# Patient Record
Sex: Female | Born: 1960 | Race: White | Hispanic: No | Smoking: Current every day smoker
Health system: Southern US, Community
[De-identification: ages and names within clinical notes are randomized; demographics above are authoritative.]

## PROBLEM LIST (undated history)

## (undated) DIAGNOSIS — J449 Chronic obstructive pulmonary disease, unspecified: Secondary | ICD-10-CM

## (undated) DIAGNOSIS — E785 Hyperlipidemia, unspecified: Secondary | ICD-10-CM

## (undated) DIAGNOSIS — I639 Cerebral infarction, unspecified: Secondary | ICD-10-CM

## (undated) DIAGNOSIS — R569 Unspecified convulsions: Secondary | ICD-10-CM

## (undated) DIAGNOSIS — I10 Essential (primary) hypertension: Secondary | ICD-10-CM

## (undated) DIAGNOSIS — G43909 Migraine, unspecified, not intractable, without status migrainosus: Secondary | ICD-10-CM

## (undated) DIAGNOSIS — J45909 Unspecified asthma, uncomplicated: Secondary | ICD-10-CM

## (undated) HISTORY — PX: BLADDER SURGERY: SHX569

---

## 2013-12-08 ENCOUNTER — Encounter (HOSPITAL_BASED_OUTPATIENT_CLINIC_OR_DEPARTMENT_OTHER): Payer: Self-pay | Admitting: Emergency Medicine

## 2013-12-08 ENCOUNTER — Emergency Department (HOSPITAL_BASED_OUTPATIENT_CLINIC_OR_DEPARTMENT_OTHER): Payer: Medicaid Other

## 2013-12-08 ENCOUNTER — Emergency Department (HOSPITAL_BASED_OUTPATIENT_CLINIC_OR_DEPARTMENT_OTHER)
Admission: EM | Admit: 2013-12-08 | Discharge: 2013-12-08 | Disposition: A | Discharge status: Home / Self Care | Payer: Medicaid Other | Attending: Emergency Medicine | Admitting: Emergency Medicine

## 2013-12-08 DIAGNOSIS — I10 Essential (primary) hypertension: Secondary | ICD-10-CM | POA: Insufficient documentation

## 2013-12-08 DIAGNOSIS — R209 Unspecified disturbances of skin sensation: Secondary | ICD-10-CM | POA: Diagnosis present

## 2013-12-08 DIAGNOSIS — J449 Chronic obstructive pulmonary disease, unspecified: Secondary | ICD-10-CM | POA: Insufficient documentation

## 2013-12-08 DIAGNOSIS — Z79899 Other long term (current) drug therapy: Secondary | ICD-10-CM | POA: Insufficient documentation

## 2013-12-08 DIAGNOSIS — Z862 Personal history of diseases of the blood and blood-forming organs and certain disorders involving the immune mechanism: Secondary | ICD-10-CM | POA: Diagnosis not present

## 2013-12-08 DIAGNOSIS — R42 Dizziness and giddiness: Secondary | ICD-10-CM | POA: Diagnosis not present

## 2013-12-08 DIAGNOSIS — F17200 Nicotine dependence, unspecified, uncomplicated: Secondary | ICD-10-CM | POA: Diagnosis not present

## 2013-12-08 DIAGNOSIS — J4489 Other specified chronic obstructive pulmonary disease: Secondary | ICD-10-CM | POA: Insufficient documentation

## 2013-12-08 DIAGNOSIS — R51 Headache: Secondary | ICD-10-CM | POA: Diagnosis not present

## 2013-12-08 DIAGNOSIS — Z8639 Personal history of other endocrine, nutritional and metabolic disease: Secondary | ICD-10-CM | POA: Insufficient documentation

## 2013-12-08 HISTORY — DX: Chronic obstructive pulmonary disease, unspecified: J44.9

## 2013-12-08 HISTORY — DX: Hyperlipidemia, unspecified: E78.5

## 2013-12-08 HISTORY — DX: Essential (primary) hypertension: I10

## 2013-12-08 HISTORY — DX: Unspecified asthma, uncomplicated: J45.909

## 2013-12-08 LAB — COMPREHENSIVE METABOLIC PANEL
ALK PHOS: 132 U/L — AB (ref 39–117)
ALT: 28 U/L (ref 0–35)
ANION GAP: 16 — AB (ref 5–15)
AST: 21 U/L (ref 0–37)
Albumin: 3.9 g/dL (ref 3.5–5.2)
BUN: 9 mg/dL (ref 6–23)
CALCIUM: 9.9 mg/dL (ref 8.4–10.5)
CO2: 26 meq/L (ref 19–32)
Chloride: 103 mEq/L (ref 96–112)
Creatinine, Ser: 0.9 mg/dL (ref 0.50–1.10)
GFR calc Af Amer: 84 mL/min — ABNORMAL LOW (ref >90)
GFR, EST NON AFRICAN AMERICAN: 72 mL/min — AB (ref >90)
GLUCOSE: 102 mg/dL — AB (ref 70–99)
POTASSIUM: 3.7 meq/L (ref 3.7–5.3)
SODIUM: 145 meq/L (ref 137–147)
Total Bilirubin: 0.2 mg/dL — ABNORMAL LOW (ref 0.3–1.2)
Total Protein: 7.8 g/dL (ref 6.0–8.3)

## 2013-12-08 LAB — CBC WITH DIFFERENTIAL/PLATELET
Basophils Absolute: 0 10*3/uL (ref 0.0–0.1)
Basophils Relative: 0 % (ref 0–1)
EOS PCT: 3 % (ref 0–5)
Eosinophils Absolute: 0.2 10*3/uL (ref 0.0–0.7)
HCT: 40.4 % (ref 36.0–46.0)
HEMOGLOBIN: 13.4 g/dL (ref 12.0–15.0)
LYMPHS ABS: 1.9 10*3/uL (ref 0.7–4.0)
Lymphocytes Relative: 21 % (ref 12–46)
MCH: 29.5 pg (ref 26.0–34.0)
MCHC: 33.2 g/dL (ref 30.0–36.0)
MCV: 89 fL (ref 78.0–100.0)
Monocytes Absolute: 0.6 10*3/uL (ref 0.1–1.0)
Monocytes Relative: 7 % (ref 3–12)
Neutro Abs: 6.1 10*3/uL (ref 1.7–7.7)
Neutrophils Relative %: 69 % (ref 43–77)
PLATELETS: 231 10*3/uL (ref 150–400)
RBC: 4.54 MIL/uL (ref 3.87–5.11)
RDW: 13.9 % (ref 11.5–15.5)
WBC: 8.9 10*3/uL (ref 4.0–10.5)

## 2013-12-08 LAB — URINALYSIS, ROUTINE W REFLEX MICROSCOPIC
Bilirubin Urine: NEGATIVE
Glucose, UA: NEGATIVE mg/dL
HGB URINE DIPSTICK: NEGATIVE
Ketones, ur: NEGATIVE mg/dL
Leukocytes, UA: NEGATIVE
NITRITE: NEGATIVE
PROTEIN: NEGATIVE mg/dL
Specific Gravity, Urine: 1.005 (ref 1.005–1.030)
Urobilinogen, UA: 0.2 mg/dL (ref 0.0–1.0)
pH: 6 (ref 5.0–8.0)

## 2013-12-08 MED ORDER — HYDROCODONE-ACETAMINOPHEN 5-325 MG PO TABS: 2.0000 | ORAL_TABLET | ORAL | Status: DC | PRN

## 2013-12-08 NOTE — ED Notes (Signed)
Reports fall on Monday, numbness/tingling all over body. Reports HA. Pt neurologically intact

## 2013-12-08 NOTE — Discharge Instructions (Signed)

## 2013-12-08 NOTE — ED Provider Notes (Signed)
Medical screening examination/treatment/procedure(s) were performed by non-physician practitioner and as supervising physician I was immediately available for consultation/collaboration.   EKG Interpretation   Date/Time:  Thursday December 08 2013 16:46:49 EDT Ventricular Rate:  75 PR Interval:  148 QRS Duration: 84 QT Interval:  396 QTC Calculation: 442 R Axis:   63 Text Interpretation:  Normal sinus rhythm Normal ECG No old tracing to  compare Confirmed by Isabell Bonafede  MD-J, Monterius Rolf [phone]) on 12/08/2013 4:47:46 PM        Linwood Dibbles, MD 12/08/13 [id number]

## 2013-12-08 NOTE — ED Provider Notes (Signed)
CSN: [id number]     Arrival date & time 12/08/13  1513 History   First MD Initiated Contact with Patient 12/08/13 1536     Chief Complaint  Patient presents with  . Numbness  . Fall     (Consider location/radiation/quality/duration/timing/severity/associated sxs/prior Treatment) Patient is a 53 y.o. female presenting with fall. The history is provided by the patient. No language interpreter was used.  Fall This is a new problem. The current episode started today. The problem occurs constantly. The problem has been gradually worsening. Associated symptoms include headaches and numbness. Pertinent negatives include no nausea. Nothing aggravates the symptoms. She has tried nothing for the symptoms. The treatment provided no relief.  Pt fell 2 days ago.  Pt reports she became dizzy and passed out.  Pt reports she told her psychiatrist who told her to come in for evaluation for stroke or seizure.   Pt has multiple area that are numb on and off.  Pt his haed and back and has pain in back and head  Past Medical History  Diagnosis Date  . Asthma   . COPD (chronic obstructive pulmonary disease)   . Hypertension   . Hyperlipidemia    History reviewed. No pertinent past surgical history. No family history on file. History  Substance Use Topics  . Smoking status: Current Every Day Smoker -- 2.00 packs/day    Types: Cigarettes  . Smokeless tobacco: Not on file  . Alcohol Use: No   OB History   Grav Para Term Preterm Abortions TAB SAB Ect Mult Living                 Review of Systems  Gastrointestinal: Negative for nausea.  Neurological: Positive for numbness and headaches.  All other systems reviewed and are negative.     Allergies  Review of patient's allergies indicates no known allergies.  Home Medications   Prior to Admission medications   Medication Sig Start Date End Date Taking? Authorizing Provider  clonazePAM (KLONOPIN) 0.5 MG tablet Take 0.5 mg by mouth 2 (two) times  daily as needed for anxiety.   Yes Historical Provider, MD   BP 145/86  Pulse 89  Temp(Src) 98 F (36.7 C) (Oral)  Resp 18  Ht  (1.626 m)  Wt 167 lb (75.751 kg)  BMI 28.65 kg/m2  SpO2 97% Physical Exam  Nursing note and vitals reviewed. Constitutional: She is oriented to person, place, and time. She appears well-developed and well-nourished.  HENT:  Head: Normocephalic and atraumatic.  Right Ear: External ear normal.  Nose: Nose normal.  Mouth/Throat: Oropharynx is clear and moist.  Eyes: EOM are normal. Pupils are equal, round, and reactive to light.  Neck: Normal range of motion. Neck supple.  Cardiovascular: Normal rate and normal heart sounds.   Pulmonary/Chest: Effort normal.  Abdominal: She exhibits no distension.  Musculoskeletal: Normal range of motion.  Neurological: She is alert and oriented to person, place, and time.  Skin: Skin is warm.  Psychiatric: She has a normal mood and affect.    ED Course  Procedures (including critical care time) Labs Review Labs Reviewed  CBC WITH DIFFERENTIAL  URINALYSIS, ROUTINE W REFLEX MICROSCOPIC  COMPREHENSIVE METABOLIC PANEL    Imaging Review Dg Lumbar Spine Complete  12/08/2013   CLINICAL DATA:  Status post fall 2 days ago hitting lumbar spine on the bath tub.  EXAM: LUMBAR SPINE - COMPLETE 4+ VIEW  COMPARISON:  None.  FINDINGS: There is no evidence of lumbar spine fracture.  Alignment is normal. There mild degenerative joint changes with anterior osteophytosis and narrowed joint space in the lumbar spine. Tubal ligation clips are noted.  IMPRESSION: No acute fracture or dislocation. Mild degenerative joint changes of lumbar spine.   Electronically Signed   By: Sherian Rein M.D.   On: 12/08/2013 16:49   Ct Head Wo Contrast  12/08/2013   CLINICAL DATA:  Status post fall 2 days ago striking the posterior aspect of the head  EXAM: CT HEAD WITHOUT CONTRAST  TECHNIQUE: Contiguous axial images were obtained from the base of  the skull through the vertex without intravenous contrast.  COMPARISON:  None.  FINDINGS: The ventricles are normal in size and position. There is no intracranial hemorrhage nor intracranial mass effect. There is no acute ischemic change. 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 There is subtle decreased density in the deep white matter of both cerebral hemispheres consistent with chronic small vessel ischemic change. The cerebellum and brainstem are unremarkable.  The observed paranasal sinuses and mastoid air cells exhibit no air-fluid levels. There is no acute skull fracture. There is no cephalohematoma.  IMPRESSION: 1. There is no acute intracranial hemorrhage nor other acute intracranial abnormality. There are mild changes of chronic small vessel ischemia. 2. There is no acute skull fracture.   Electronically Signed   By: David  [place]   On: 12/08/2013 16:39     EKG Interpretation   Date/Time:  Thursday December 08 2013 16:46:49 EDT Ventricular Rate:  75 PR Interval:  148 QRS Duration: 84 QT Interval:  396 QTC Calculation: 442 R Axis:   63 Text Interpretation:  Normal sinus rhythm Normal ECG No old tracing to  compare Confirmed by KNAPP  MD-J, JON ([id number]) on 12/08/2013 4:47:46 PM     Results for orders placed during the hospital encounter of 12/08/13  CBC WITH DIFFERENTIAL      Result Value Ref Range   WBC 8.9  4.0 - 10.5 K/uL   RBC 4.54  3.87 - 5.11 MIL/uL   Hemoglobin 13.4  12.0 - 15.0 g/dL   HCT 60.4  54.0 - 98.1 %   MCV 89.0  78.0 - 100.0 fL   MCH 29.5  26.0 - 34.0 pg   MCHC 33.2  30.0 - 36.0 g/dL   RDW 19.1  47.8 - 29.5 %   Platelets 231  150 - 400 K/uL   Neutrophils Relative % 69  43 - 77 %   Neutro Abs 6.1  1.7 - 7.7 K/uL   Lymphocytes Relative 21  12 - 46 %   Lymphs Abs 1.9  0.7 - 4.0 K/uL   Monocytes Relative 7  3 - 12 %   Monocytes Absolute 0.6  0.1 - 1.0 K/uL   Eosinophils Relative 3  0 - 5 %   Eosinophils  Absolute 0.2  0.0 - 0.7 K/uL   Basophils Relative 0  0 - 1 %   Basophils Absolute 0.0  0.0 - 0.1 K/uL  COMPREHENSIVE METABOLIC PANEL      Result Value Ref Range   Sodium 145  137 - 147 mEq/L   Potassium 3.7  3.7 - 5.3 mEq/L   Chloride 103  96 - 112 mEq/L   CO2 26  19 - 32 mEq/L   Glucose, Bld 102 (*) 70 - 99 mg/dL   BUN 9  6 - 23 mg/dL   Creatinine, Ser 6.21  0.50 - 1.10 mg/dL  Calcium 9.9  8.4 - 10.5 mg/dL   Total Protein 7.8  6.0 - 8.3 g/dL   Albumin 3.9  3.5 - 5.2 g/dL   AST 21  0 - 37 U/L   ALT 28  0 - 35 U/L   Alkaline Phosphatase 132 (*) 39 - 117 U/L   Total Bilirubin 0.2 (*) 0.3 - 1.2 mg/dL   GFR calc non Af Amer 72 (*) >90 mL/min   GFR calc Af Amer 84 (*) >90 mL/min   Anion gap 16 (*) 5 - 15  URINALYSIS, ROUTINE W REFLEX MICROSCOPIC      Result Value Ref Range   Color, Urine YELLOW  YELLOW   APPearance CLEAR  CLEAR   Specific Gravity, Urine 1.005  1.005 - 1.030   pH 6.0  5.0 - 8.0   Glucose, UA NEGATIVE  NEGATIVE mg/dL   Hgb urine dipstick NEGATIVE  NEGATIVE   Bilirubin Urine NEGATIVE  NEGATIVE   Ketones, ur NEGATIVE  NEGATIVE mg/dL   Protein, ur NEGATIVE  NEGATIVE mg/dL   Urobilinogen, UA 0.2  0.0 - 1.0 mg/dL   Nitrite NEGATIVE  NEGATIVE   Leukocytes, UA NEGATIVE  NEGATIVE   Dg Lumbar Spine Complete  12/08/2013   CLINICAL DATA:  Status post fall 2 days ago hitting lumbar spine on the bath tub.  EXAM: LUMBAR SPINE - COMPLETE 4+ VIEW  COMPARISON:  None.  FINDINGS: There is no evidence of lumbar spine fracture. Alignment is normal. There mild degenerative joint changes with anterior osteophytosis and narrowed joint space in the lumbar spine. Tubal ligation clips are noted.  IMPRESSION: No acute fracture or dislocation. Mild degenerative joint changes of lumbar spine.   Electronically Signed   By: Sherian Rein M.D.   On: 12/08/2013 16:49   Ct Head Wo Contrast  12/08/2013   CLINICAL DATA:  Status post fall 2 days ago striking the posterior aspect of the head  EXAM: CT  HEAD WITHOUT CONTRAST  TECHNIQUE: Contiguous axial images were obtained from the base of the skull through the vertex without intravenous contrast.  COMPARISON:  None.  FINDINGS: The ventricles are normal in size and position. There is no intracranial hemorrhage nor intracranial mass effect. There is no acute ischemic change. 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 There is subtle decreased density in the deep white matter of both cerebral hemispheres consistent with chronic small vessel ischemic change. The cerebellum and brainstem are unremarkable.  The observed paranasal sinuses and mastoid air cells exhibit no air-fluid levels. There is no acute skull fracture. There is no cephalohematoma.  IMPRESSION: 1. There is no acute intracranial hemorrhage nor other acute intracranial abnormality. There are mild changes of chronic small vessel ischemia. 2. There is no acute skull fracture.   Electronically Signed   By: David  [place]   On: 12/08/2013 16:39    MDM normal neuro exam.   I doubt cva,  Pt advised to follow up with her MD for recheck   Final diagnoses:  Dizziness        Elson Areas, PA-C 12/08/13 Rickey Primus

## 2014-01-04 ENCOUNTER — Emergency Department (HOSPITAL_COMMUNITY): Payer: Medicaid Other

## 2014-01-04 ENCOUNTER — Emergency Department (HOSPITAL_COMMUNITY)
Admission: EM | Admit: 2014-01-04 | Discharge: 2014-01-04 | Disposition: A | Discharge status: Home / Self Care | Payer: Medicaid Other | Attending: Emergency Medicine | Admitting: Emergency Medicine

## 2014-01-04 ENCOUNTER — Encounter (HOSPITAL_COMMUNITY): Payer: Self-pay | Admitting: Emergency Medicine

## 2014-01-04 DIAGNOSIS — Y929 Unspecified place or not applicable: Secondary | ICD-10-CM | POA: Diagnosis not present

## 2014-01-04 DIAGNOSIS — Z720 Tobacco use: Secondary | ICD-10-CM | POA: Insufficient documentation

## 2014-01-04 DIAGNOSIS — S0003XA Contusion of scalp, initial encounter: Secondary | ICD-10-CM | POA: Insufficient documentation

## 2014-01-04 DIAGNOSIS — S0990XA Unspecified injury of head, initial encounter: Secondary | ICD-10-CM | POA: Insufficient documentation

## 2014-01-04 DIAGNOSIS — R000 Tachycardia, unspecified: Secondary | ICD-10-CM | POA: Insufficient documentation

## 2014-01-04 DIAGNOSIS — F419 Anxiety disorder, unspecified: Secondary | ICD-10-CM | POA: Diagnosis not present

## 2014-01-04 DIAGNOSIS — J449 Chronic obstructive pulmonary disease, unspecified: Secondary | ICD-10-CM | POA: Diagnosis not present

## 2014-01-04 DIAGNOSIS — R569 Unspecified convulsions: Secondary | ICD-10-CM | POA: Diagnosis present

## 2014-01-04 DIAGNOSIS — Z8639 Personal history of other endocrine, nutritional and metabolic disease: Secondary | ICD-10-CM | POA: Diagnosis not present

## 2014-01-04 DIAGNOSIS — I10 Essential (primary) hypertension: Secondary | ICD-10-CM | POA: Insufficient documentation

## 2014-01-04 DIAGNOSIS — Y939 Activity, unspecified: Secondary | ICD-10-CM | POA: Insufficient documentation

## 2014-01-04 DIAGNOSIS — Z79899 Other long term (current) drug therapy: Secondary | ICD-10-CM | POA: Insufficient documentation

## 2014-01-04 DIAGNOSIS — W07XXXA Fall from chair, initial encounter: Secondary | ICD-10-CM | POA: Insufficient documentation

## 2014-01-04 LAB — CBC
HCT: 46.8 % — ABNORMAL HIGH (ref 36.0–46.0)
Hemoglobin: 15.5 g/dL — ABNORMAL HIGH (ref 12.0–15.0)
MCH: 29.9 pg (ref 26.0–34.0)
MCHC: 33.1 g/dL (ref 30.0–36.0)
MCV: 90.2 fL (ref 78.0–100.0)
Platelets: 263 10*3/uL (ref 150–400)
RBC: 5.19 MIL/uL — ABNORMAL HIGH (ref 3.87–5.11)
RDW: 13.7 % (ref 11.5–15.5)
WBC: 12.5 10*3/uL — ABNORMAL HIGH (ref 4.0–10.5)

## 2014-01-04 LAB — BASIC METABOLIC PANEL
ANION GAP: 12 (ref 5–15)
BUN: 10 mg/dL (ref 6–23)
CALCIUM: 10.1 mg/dL (ref 8.4–10.5)
CO2: 29 mEq/L (ref 19–32)
CREATININE: 0.73 mg/dL (ref 0.50–1.10)
Chloride: 102 mEq/L (ref 96–112)
GFR calc Af Amer: >90 mL/min (ref >90)
Glucose, Bld: 105 mg/dL — ABNORMAL HIGH (ref 70–99)
Potassium: 4.5 mEq/L (ref 3.7–5.3)
Sodium: 143 mEq/L (ref 137–147)

## 2014-01-04 LAB — ETHANOL

## 2014-01-04 LAB — RAPID URINE DRUG SCREEN, HOSP PERFORMED
Amphetamines: NOT DETECTED
BARBITURATES: NOT DETECTED
Benzodiazepines: NOT DETECTED
Cocaine: NOT DETECTED
Opiates: NOT DETECTED
Tetrahydrocannabinol: NOT DETECTED

## 2014-01-04 LAB — URINALYSIS, ROUTINE W REFLEX MICROSCOPIC
BILIRUBIN URINE: NEGATIVE
Glucose, UA: NEGATIVE mg/dL
KETONES UR: NEGATIVE mg/dL
Leukocytes, UA: NEGATIVE
NITRITE: NEGATIVE
PH: 7 (ref 5.0–8.0)
PROTEIN: NEGATIVE mg/dL
Specific Gravity, Urine: 1.007 (ref 1.005–1.030)
Urobilinogen, UA: 0.2 mg/dL (ref 0.0–1.0)

## 2014-01-04 LAB — URINE MICROSCOPIC-ADD ON

## 2014-01-04 LAB — I-STAT CG4 LACTIC ACID, ED: Lactic Acid, Venous: 1.74 mmol/L (ref 0.5–2.2)

## 2014-01-04 MED ORDER — ACETAMINOPHEN 325 MG PO TABS
650.0000 mg | ORAL_TABLET | Freq: Once | ORAL | Status: AC
  Administered 2014-01-04: 650 mg via ORAL
  Filled 2014-01-04: qty 2

## 2014-01-04 NOTE — ED Provider Notes (Signed)
CSN: [id number]     Arrival date & time 01/04/14  1343 History   First MD Initiated Contact with Patient 01/04/14 1351     Chief Complaint  Patient presents with  . Seizures     (Consider location/radiation/quality/duration/timing/severity/associated sxs/prior Treatment) HPI  This is a 53 y/o female with a PMHx of asthma, COPD, HTN, hyperlipidemia, depression and anxiety who presents to the ED via EMS from her psychiatrists office after having a possible pseudoseizure witnessed by psychiatrist. She was noted to fall forward out of her chair face first onto the ground. No LOC. Pt reports prior to the episode she felt "shaky". No hx of seizures, however did have a similar episode about 2 months ago. No post-ictal state reported. Currently pt is complaining of pain to her forehead where she landed. Denies confusion, vision change, lightheadedness, dizziness, weakness, cp, sob, n/v. Admits to being under a great amount of stress and anxiety. Denies SI/HI.   Past Medical History  Diagnosis Date  . Asthma   . COPD (chronic obstructive pulmonary disease)   . Hypertension   . Hyperlipidemia    No past surgical history on file. No family history on file. History  Substance Use Topics  . Smoking status: Current Every Day Smoker -- 2.00 packs/day    Types: Cigarettes  . Smokeless tobacco: Not on file  . Alcohol Use: No   OB History   Grav Para Term Preterm Abortions TAB SAB Ect Mult Living                 Review of Systems  Skin: Positive for wound (abrasions).  Neurological: Positive for seizures.  All other systems reviewed and are negative.     Allergies  Review of patient's allergies indicates no known allergies.  Home Medications   Prior to Admission medications   Medication Sig Start Date End Date Taking? Authorizing Provider  clonazePAM (KLONOPIN) 2 MG tablet Take 2 mg by mouth 3 (three) times daily as needed for anxiety.   Yes Historical Provider, MD    ipratropium-albuterol (DUONEB) 0.5-2.5 (3) MG/3ML SOLN Take 3 mLs by nebulization 4 (four) times daily as needed (wheezing).   Yes Historical Provider, MD  mirtazapine (REMERON) 15 MG tablet Take 15 mg by mouth at bedtime.   Yes Historical Provider, MD  zolpidem (AMBIEN) 5 MG tablet Take 5 mg by mouth at bedtime as needed for sleep.   Yes Historical Provider, MD   BP 165/84  Pulse 82  Temp(Src) 99.8 F (37.7 C) (Oral)  Resp 15  SpO2 100% Physical Exam  Nursing note and vitals reviewed. Constitutional: She is oriented to person, place, and time. She appears well-developed and well-nourished. No distress.  HENT:  Head: Normocephalic.    Right Ear: No hemotympanum.  Left Ear: No hemotympanum.  Nose:    Mouth/Throat: Oropharynx is clear and moist.  Nares patent. No epistaxis.  Eyes: Conjunctivae and EOM are normal. Pupils are equal, round, and reactive to light.  Neck: Normal range of motion. Neck supple.  Cardiovascular: Regular rhythm, normal heart sounds and intact distal pulses.   Mild tachycardia.  Pulmonary/Chest: Effort normal and breath sounds normal. No respiratory distress.  Abdominal: Soft. Bowel sounds are normal. There is no tenderness.  Musculoskeletal: Normal range of motion. She exhibits no edema.  Neurological: She is alert and oriented to person, place, and time. She has normal strength. No cranial nerve deficit or sensory deficit. Coordination and gait normal.  Speech fluent, goal oriented. Moves limbs without  ataxia. Equal grip strength bilateral.  Skin: Skin is warm and dry. No rash noted. She is not diaphoretic.  Psychiatric: Her behavior is normal.  Anxious.    ED Course  Procedures (including critical care time) Labs Review Labs Reviewed  CBC - Abnormal; Notable for the following:    WBC 12.5 (*)    RBC 5.19 (*)    Hemoglobin 15.5 (*)    HCT 46.8 (*)    All other components within normal limits  BASIC METABOLIC PANEL - Abnormal; Notable for the  following:    Glucose, Bld 105 (*)    All other components within normal limits  URINE RAPID DRUG SCREEN (HOSP PERFORMED)  ETHANOL  URINALYSIS, ROUTINE W REFLEX MICROSCOPIC  I-STAT CG4 LACTIC ACID, ED    Imaging Review Ct Head Wo Contrast  01/04/2014   CLINICAL DATA:  Seizure  EXAM: CT HEAD WITHOUT CONTRAST  CT MAXILLOFACIAL WITHOUT CONTRAST  TECHNIQUE: Multidetector CT imaging of the head and maxillofacial structures were performed using the standard protocol without intravenous contrast. Multiplanar CT image reconstructions of the maxillofacial structures were also generated.  COMPARISON:  CT head 12/08/2013  FINDINGS: CT HEAD FINDINGS  Frontal scalp contusion in the midline.  Negative for skull fracture  Ventricle size is normal. Negative for acute or chronic ischemia. Negative for mass or hemorrhage.  CT MAXILLOFACIAL FINDINGS  Negative for facial fracture. No fracture the orbit or mandible. Nasal bone intact. Mild mucosal edema in the sinuses without air-fluid level. Mastoid sinuses are clear.  Soft tissue contusion anteriorly in the midline.  IMPRESSION: Anterior scalp hematoma.  Negative for skull fracture.  Negative CT head  Negative for facial fracture   Electronically Signed   By: Marlan Palauharles  Clark M.D.   On: 01/04/2014 14:55   Ct Maxillofacial Wo Cm  01/04/2014   CLINICAL DATA:  Seizure  EXAM: CT HEAD WITHOUT CONTRAST  CT MAXILLOFACIAL WITHOUT CONTRAST  TECHNIQUE: Multidetector CT imaging of the head and maxillofacial structures were performed using the standard protocol without intravenous contrast. Multiplanar CT image reconstructions of the maxillofacial structures were also generated.  COMPARISON:  CT head 12/08/2013  FINDINGS: CT HEAD FINDINGS  Frontal scalp contusion in the midline.  Negative for skull fracture  Ventricle size is normal. Negative for acute or chronic ischemia. Negative for mass or hemorrhage.  CT MAXILLOFACIAL FINDINGS  Negative for facial fracture. No fracture the  orbit or mandible. Nasal bone intact. Mild mucosal edema in the sinuses without air-fluid level. Mastoid sinuses are clear.  Soft tissue contusion anteriorly in the midline.  IMPRESSION: Anterior scalp hematoma.  Negative for skull fracture.  Negative CT head  Negative for facial fracture   Electronically Signed   By: Marlan Palauharles  Clark M.D.   On: 01/04/2014 14:55     EKG Interpretation None      MDM   Final diagnoses:  Anxiety  Scalp hematoma, initial encounter   Patient presenting after possible pseudoseizure. Witness by her psychiatrist. She is nontoxic-appearing and in no apparent distress. Mild tachycardia on initial exam, resolved after sitting in the emergency department. She appears anxious. Head and maxillofacial CT showing anterior scalp hematoma, no skull fracture, negative head CT. Labs showing leukocytosis of 12.5, otherwise no acute findings. Normal lactate. Drug screen panel negative. Patient does admit to increased stress and anxiety and most likely had a panic attack. Stable for discharge with PCP followup. Return precautions given. Patient states understanding of treatment care plan and is agreeable.  Kathrynn SpeedRobyn M Merrily Tegeler, PA-C 01/07/14  1200 

## 2014-01-04 NOTE — ED Notes (Signed)
Per EMS: Pt from psychiatrist office.  Witnessed possible pseudo seizure.  Larey SeatFell forward out of chair onto ground. Abrasions to forehead and nose.  Pt not postictal upon EMS arrival.  Currently A&O x 4.  No hx of seizures.  Had same type of episode 2 months ago.  Pt states same thing happened this morning around 1030.  [facility]

## 2014-01-04 NOTE — Discharge Instructions (Signed)
Apply ice to your forehead. Followup with your doctor.  Facial or Scalp Contusion A facial or scalp contusion is a deep bruise on the face or head. Injuries to the face and head generally cause a lot of swelling, especially around the eyes. Contusions are the result of an injury that caused bleeding under the skin. The contusion may turn blue, purple, or yellow. Minor injuries will give you a painless contusion, but more severe contusions may stay painful and swollen for a few weeks.  CAUSES  A facial or scalp contusion is caused by a blunt injury or trauma to the face or head area.  SIGNS AND SYMPTOMS   Swelling of the injured area.   Discoloration of the injured area.   Tenderness, soreness, or pain in the injured area.  DIAGNOSIS  The diagnosis can be made by taking a medical history and doing a physical exam. An X-ray exam, CT scan, or MRI may be needed to determine if there are any associated injuries, such as broken bones (fractures). TREATMENT  Often, the best treatment for a facial or scalp contusion is applying cold compresses to the injured area. Over-the-counter medicines may also be recommended for pain control.  HOME CARE INSTRUCTIONS   Only take over-the-counter or prescription medicines as directed by your health care provider.   Apply ice to the injured area.   Put ice in a plastic bag.   Place a towel between your skin and the bag.   Leave the ice on for 20 minutes, 2-3 times a day.  SEEK MEDICAL CARE IF:  You have bite problems.   You have pain with chewing.   You are concerned about facial defects. SEEK IMMEDIATE MEDICAL CARE IF:  You have severe pain or a headache that is not relieved by medicine.   You have unusual sleepiness, confusion, or personality changes.   You throw up (vomit).   You have a persistent nosebleed.   You have double vision or blurred vision.   You have fluid drainage from your nose or ear.   You have difficulty  walking or using your arms or legs.  MAKE SURE YOU:   Understand these instructions.  Will watch your condition.  Will get help right away if you are not doing well or get worse. Document Released: 04/17/2004 Document Revised: 12/29/2012 Document Reviewed: 10/21/2012 [facility] Patient Information 2015 [place], [place]. This information is not intended to replace advice given to you by your health care provider. Make sure you discuss any questions you have with your health care provider.  Head Injury You have received a head injury. It does not appear serious at this time. Headaches and vomiting are common following head injury. It should be easy to awaken from sleeping. Sometimes it is necessary for you to stay in the emergency department for a while for observation. Sometimes admission to the hospital may be needed. After injuries such as yours, most problems occur within the first 24 hours, but side effects may occur up to 7-10 days after the injury. It is important for you to carefully monitor your condition and contact your health care provider or seek immediate medical care if there is a change in your condition. WHAT ARE THE TYPES OF HEAD INJURIES? Head injuries can be as minor as a bump. Some head injuries can be more severe. More severe head injuries include:  A jarring injury to the brain (concussion).  A bruise of the brain (contusion). This mean there is bleeding in the  brain that can cause swelling.  A cracked skull (skull fracture).  Bleeding in the brain that collects, clots, and forms a bump (hematoma). WHAT CAUSES A HEAD INJURY? A serious head injury is most likely to happen to someone who is in a car wreck and is not wearing a seat belt. Other causes of major head injuries include bicycle or motorcycle accidents, sports injuries, and falls. HOW ARE HEAD INJURIES DIAGNOSED? A complete history of the event leading to the injury and your current symptoms will be helpful in  diagnosing head injuries. Many times, pictures of the brain, such as CT or MRI are needed to see the extent of the injury. Often, an overnight hospital stay is necessary for observation.  WHEN SHOULD I SEEK IMMEDIATE MEDICAL CARE?  You should get help right away if:  You have confusion or drowsiness.  You feel sick to your stomach (nauseous) or have continued, forceful vomiting.  You have dizziness or unsteadiness that is getting worse.  You have severe, continued headaches not relieved by medicine. Only take over-the-counter or prescription medicines for pain, fever, or discomfort as directed by your health care provider.  You do not have normal function of the arms or legs or are unable to walk.  You notice changes in the black spots in the center of the colored part of your eye (pupil).  You have a clear or bloody fluid coming from your nose or ears.  You have a loss of vision. During the next 24 hours after the injury, you must stay with someone who can watch you for the warning signs. This person should contact local emergency services (911 in the [place] if you have seizures, you become unconscious, or you are unable to wake up. HOW CAN I PREVENT A HEAD INJURY IN THE FUTURE? The most important factor for preventing major head injuries is avoiding motor vehicle accidents. To minimize the potential for damage to your head, it is crucial to wear seat belts while riding in motor vehicles. Wearing helmets while bike riding and playing collision sports (like football) is also helpful. Also, avoiding dangerous activities around the house will further help reduce your risk of head injury.  WHEN CAN I RETURN TO NORMAL ACTIVITIES AND ATHLETICS? You should be reevaluated by your health care provider before returning to these activities. If you have any of the following symptoms, you should not return to activities or contact sports until 1 week after the symptoms have stopped:  Persistent  headache.  Dizziness or vertigo.  Poor attention and concentration.  Confusion.  Memory problems.  Nausea or vomiting.  Fatigue or tire easily.  Irritability.  Intolerant of bright lights or loud noises.  Anxiety or depression.  Disturbed sleep. MAKE SURE YOU:   Understand these instructions.  Will watch your condition.  Will get help right away if you are not doing well or get worse. Document Released: 03/10/2005 Document Revised: 03/15/2013 Document Reviewed: 11/15/2012 [facility]  [facility] Patient Information 2015 [place], [place]. This information is not intended to replace advice given to you by your health care provider. Make sure you discuss any questions you have with your health care provider.  Panic Attacks Panic attacks are sudden, short feelings of great fear or discomfort. You may have them for no reason when you are relaxed, when you are uneasy (anxious), or when you are sleeping.  HOME CARE  Take all your medicines as told.  Check with your doctor before starting new medicines.  Keep all doctor visits. GET  HELP IF:  You are not able to take your medicines as told.  Your symptoms do not get better.  Your symptoms get worse. GET HELP RIGHT AWAY IF:  Your attacks seem different than your normal attacks.  You have thoughts about hurting yourself or others.  You take panic attack medicine and you have a side effect. MAKE SURE YOU:  Understand these instructions.  Will watch your condition.  Will get help right away if you are not doing well or get worse. Document Released: 04/12/2010 Document Revised: 12/29/2012 Document Reviewed: 10/22/2012 [facility] Patient Information 2015 [place], [place]. This information is not intended to replace advice given to you by your health care provider. Make sure you discuss any questions you have with your health care provider.

## 2014-01-07 NOTE — ED Provider Notes (Signed)
Medical screening examination/treatment/procedure(s) were performed by non-physician practitioner and as supervising physician I was immediately available for consultation/collaboration.   EKG Interpretation None        Layla MawKristen N Asenath Balash, DO 01/07/14 1529

## 2014-03-18 ENCOUNTER — Emergency Department (HOSPITAL_BASED_OUTPATIENT_CLINIC_OR_DEPARTMENT_OTHER)
Admission: EM | Admit: 2014-03-18 | Discharge: 2014-03-18 | Disposition: A | Discharge status: Home / Self Care | Payer: Medicaid Other | Attending: Emergency Medicine | Admitting: Emergency Medicine

## 2014-03-18 ENCOUNTER — Encounter (HOSPITAL_BASED_OUTPATIENT_CLINIC_OR_DEPARTMENT_OTHER): Payer: Self-pay | Admitting: *Deleted

## 2014-03-18 ENCOUNTER — Emergency Department (HOSPITAL_BASED_OUTPATIENT_CLINIC_OR_DEPARTMENT_OTHER): Payer: Medicaid Other

## 2014-03-18 DIAGNOSIS — S79912A Unspecified injury of left hip, initial encounter: Secondary | ICD-10-CM | POA: Insufficient documentation

## 2014-03-18 DIAGNOSIS — Z79899 Other long term (current) drug therapy: Secondary | ICD-10-CM | POA: Insufficient documentation

## 2014-03-18 DIAGNOSIS — W010XXA Fall on same level from slipping, tripping and stumbling without subsequent striking against object, initial encounter: Secondary | ICD-10-CM | POA: Insufficient documentation

## 2014-03-18 DIAGNOSIS — Z720 Tobacco use: Secondary | ICD-10-CM | POA: Insufficient documentation

## 2014-03-18 DIAGNOSIS — Y998 Other external cause status: Secondary | ICD-10-CM | POA: Diagnosis not present

## 2014-03-18 DIAGNOSIS — I10 Essential (primary) hypertension: Secondary | ICD-10-CM | POA: Insufficient documentation

## 2014-03-18 DIAGNOSIS — Y93E1 Activity, personal bathing and showering: Secondary | ICD-10-CM | POA: Insufficient documentation

## 2014-03-18 DIAGNOSIS — Z8639 Personal history of other endocrine, nutritional and metabolic disease: Secondary | ICD-10-CM | POA: Insufficient documentation

## 2014-03-18 DIAGNOSIS — W19XXXA Unspecified fall, initial encounter: Secondary | ICD-10-CM

## 2014-03-18 DIAGNOSIS — M25552 Pain in left hip: Secondary | ICD-10-CM

## 2014-03-18 DIAGNOSIS — Y9289 Other specified places as the place of occurrence of the external cause: Secondary | ICD-10-CM | POA: Diagnosis not present

## 2014-03-18 DIAGNOSIS — J45909 Unspecified asthma, uncomplicated: Secondary | ICD-10-CM | POA: Insufficient documentation

## 2014-03-18 MED ORDER — TRAMADOL-ACETAMINOPHEN 37.5-325 MG PO TABS: 1.0000 | ORAL_TABLET | Freq: Four times a day (QID) | ORAL | Status: DC | PRN

## 2014-03-18 NOTE — Discharge Instructions (Signed)
Take the prescribed medication as directed. °Follow-up with your primary care physician. °Return to the ED for new or worsening symptoms. ° °

## 2014-03-18 NOTE — ED Notes (Signed)
Pt c/o fall x 1 day ago from standing in shower c/o left hip pain

## 2014-03-18 NOTE — ED Provider Notes (Signed)
CSN: [id number]     Arrival date & time 03/18/14  1232 History   First MD Initiated Contact with Patient 03/18/14 1350     Chief Complaint  Patient presents with  . Fall     (Consider location/radiation/quality/duration/timing/severity/associated sxs/prior Treatment) Patient is a 53 y.o. female presenting with fall. The history is provided by the patient and medical records.  Fall Associated symptoms include arthralgias.    This is a [age] year old female with past medical history significant for hypertension, hyperlipidemia, asthma, COPD, presenting to the ED for left hip pain. Patient states yesterday while taking a shower she slipped on a bar of soap and fell onto her left hip. No head injury or loss of consciousness. States since this time she has had progressively worsening left hip pain, worse with weightbearing and ambulation. She has been using a cane to help assist ambulation which she does not normally have to do. She denies any numbness, paresthesias, or weakness of left leg.  Past Medical History  Diagnosis Date  . Asthma   . COPD (chronic obstructive pulmonary disease)   . Hypertension   . Hyperlipidemia    Past Surgical History  Procedure Laterality Date  . Bladder surgery     History reviewed. No pertinent family history. History  Substance Use Topics  . Smoking status: Current Every Day Smoker -- 0.50 packs/day    Types: Cigarettes  . Smokeless tobacco: Not on file  . Alcohol Use: No   OB History    No data available     Review of Systems  Musculoskeletal: Positive for arthralgias.  All other systems reviewed and are negative.     Allergies  Review of patient's allergies indicates no known allergies.  Home Medications   Prior to Admission medications   Medication Sig Start Date End Date Taking? Authorizing Provider  clonazePAM (KLONOPIN) 2 MG tablet Take 2 mg by mouth 3 (three) times daily as needed for anxiety.    Historical Provider, MD    ipratropium-albuterol (DUONEB) 0.5-2.5 (3) MG/3ML SOLN Take 3 mLs by nebulization 4 (four) times daily as needed (wheezing).    Historical Provider, MD  mirtazapine (REMERON) 15 MG tablet Take 15 mg by mouth at bedtime.    Historical Provider, MD  zolpidem (AMBIEN) 5 MG tablet Take 5 mg by mouth at bedtime as needed for sleep.    Historical Provider, MD   BP 170/80 mmHg  Pulse 90  Temp(Src) 98.4 F (36.9 C)  Resp 16  Ht 5\' 4"  (1.626 m)  Wt 163 lb (73.936 kg)  BMI 27.97 kg/m2  SpO2 97% Physical Exam  Constitutional: She is oriented to person, place, and time. She appears well-developed and well-nourished. No distress.  HENT:  Head: Normocephalic and atraumatic.  Mouth/Throat: Oropharynx is clear and moist.  Eyes: Conjunctivae and EOM are normal. Pupils are equal, round, and reactive to light.  Neck: Normal range of motion. Neck supple.  Cardiovascular: Normal rate, regular rhythm and normal heart sounds.   Pulmonary/Chest: Effort normal and breath sounds normal. No respiratory distress. She has no wheezes.  Musculoskeletal: Normal range of motion. She exhibits no edema.       Left hip: She exhibits tenderness and bony tenderness. She exhibits normal range of motion, normal strength, no swelling, no crepitus, no deformity and no laceration.       Legs: Left hip with tenderness along lateral aspect, no gross bony deformities, no leg shortening; full range of motion maintained, some pain with full flexion; leg  remains neurovascularly intact; ambulating assisted with cane  Neurological: She is alert and oriented to person, place, and time.  Skin: Skin is warm and dry. She is not diaphoretic.  Psychiatric: She has a normal mood and affect.  Nursing note and vitals reviewed.   ED Course  Procedures (including critical care time) Labs Review Labs Reviewed - No data to display  Imaging Review Dg Hip Complete Left  03/18/2014   CLINICAL DATA:  Status post fall last night.  Left hip  pain.  EXAM: LEFT HIP - COMPLETE 2+ VIEW  COMPARISON:  None.  FINDINGS: No acute bony or joint abnormality is identified. 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 No focal bony lesion is seen. Tubal ligation clips are noted.  IMPRESSION: No acute abnormality.  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   Electronically Signed   By: Drusilla Kannerhomas  Dalessio M.D.   On: 03/18/2014 13:07     EKG Interpretation None      MDM   Final diagnoses:  Fall, initial encounter  Left hip pain   Imaging negative for acute fracture or dislocation, suspect bony contusion. Patient advised on supportive care, ultracet given. Will follow up with primary care physician if no improvement in the next few days.  Discussed plan with patient, he/she acknowledged understanding and agreed with plan of care.  Return precautions given for new or worsening symptoms.   Garlon HatchetLisa M Shanyn Preisler, PA-C 03/18/14 1430  Joya Gaskinsonald W Wickline, MD 03/18/14 [phone]

## 2014-10-06 ENCOUNTER — Emergency Department (HOSPITAL_BASED_OUTPATIENT_CLINIC_OR_DEPARTMENT_OTHER): Payer: Medicaid Other

## 2014-10-06 ENCOUNTER — Encounter (HOSPITAL_BASED_OUTPATIENT_CLINIC_OR_DEPARTMENT_OTHER): Payer: Self-pay | Admitting: *Deleted

## 2014-10-06 ENCOUNTER — Emergency Department (HOSPITAL_BASED_OUTPATIENT_CLINIC_OR_DEPARTMENT_OTHER)
Admission: EM | Admit: 2014-10-06 | Discharge: 2014-10-06 | Disposition: A | Discharge status: Home / Self Care | Payer: Medicaid Other | Attending: Emergency Medicine | Admitting: Emergency Medicine

## 2014-10-06 DIAGNOSIS — W1839XA Other fall on same level, initial encounter: Secondary | ICD-10-CM | POA: Insufficient documentation

## 2014-10-06 DIAGNOSIS — W19XXXA Unspecified fall, initial encounter: Secondary | ICD-10-CM

## 2014-10-06 DIAGNOSIS — Z720 Tobacco use: Secondary | ICD-10-CM | POA: Insufficient documentation

## 2014-10-06 DIAGNOSIS — J449 Chronic obstructive pulmonary disease, unspecified: Secondary | ICD-10-CM | POA: Diagnosis not present

## 2014-10-06 DIAGNOSIS — S3992XA Unspecified injury of lower back, initial encounter: Secondary | ICD-10-CM | POA: Insufficient documentation

## 2014-10-06 DIAGNOSIS — Y9389 Activity, other specified: Secondary | ICD-10-CM | POA: Insufficient documentation

## 2014-10-06 DIAGNOSIS — S50312A Abrasion of left elbow, initial encounter: Secondary | ICD-10-CM | POA: Insufficient documentation

## 2014-10-06 DIAGNOSIS — Z79899 Other long term (current) drug therapy: Secondary | ICD-10-CM | POA: Diagnosis not present

## 2014-10-06 DIAGNOSIS — I10 Essential (primary) hypertension: Secondary | ICD-10-CM | POA: Diagnosis not present

## 2014-10-06 DIAGNOSIS — S99912A Unspecified injury of left ankle, initial encounter: Secondary | ICD-10-CM | POA: Diagnosis present

## 2014-10-06 DIAGNOSIS — S0003XA Contusion of scalp, initial encounter: Secondary | ICD-10-CM | POA: Insufficient documentation

## 2014-10-06 DIAGNOSIS — S93402A Sprain of unspecified ligament of left ankle, initial encounter: Secondary | ICD-10-CM

## 2014-10-06 DIAGNOSIS — R55 Syncope and collapse: Secondary | ICD-10-CM | POA: Diagnosis not present

## 2014-10-06 DIAGNOSIS — Z8639 Personal history of other endocrine, nutritional and metabolic disease: Secondary | ICD-10-CM | POA: Insufficient documentation

## 2014-10-06 DIAGNOSIS — S4992XA Unspecified injury of left shoulder and upper arm, initial encounter: Secondary | ICD-10-CM | POA: Diagnosis not present

## 2014-10-06 DIAGNOSIS — Y998 Other external cause status: Secondary | ICD-10-CM | POA: Insufficient documentation

## 2014-10-06 DIAGNOSIS — Y9289 Other specified places as the place of occurrence of the external cause: Secondary | ICD-10-CM | POA: Diagnosis not present

## 2014-10-06 DIAGNOSIS — Z3202 Encounter for pregnancy test, result negative: Secondary | ICD-10-CM | POA: Insufficient documentation

## 2014-10-06 HISTORY — DX: Unspecified convulsions: R56.9

## 2014-10-06 LAB — BASIC METABOLIC PANEL
Anion gap: 10 (ref 5–15)
BUN: 8 mg/dL (ref 6–20)
CALCIUM: 9.2 mg/dL (ref 8.9–10.3)
CO2: 27 mmol/L (ref 22–32)
Chloride: 101 mmol/L (ref 101–111)
Creatinine, Ser: 0.78 mg/dL (ref 0.44–1.00)
Glucose, Bld: 119 mg/dL — ABNORMAL HIGH (ref 65–99)
Potassium: 3.8 mmol/L (ref 3.5–5.1)
Sodium: 138 mmol/L (ref 135–145)

## 2014-10-06 LAB — CBC WITH DIFFERENTIAL/PLATELET
Basophils Absolute: 0 10*3/uL (ref 0.0–0.1)
Basophils Relative: 0 % (ref 0–1)
Eosinophils Absolute: 0.2 10*3/uL (ref 0.0–0.7)
Eosinophils Relative: 2 % (ref 0–5)
HCT: 42.4 % (ref 36.0–46.0)
Hemoglobin: 13.5 g/dL (ref 12.0–15.0)
LYMPHS ABS: 2 10*3/uL (ref 0.7–4.0)
Lymphocytes Relative: 20 % (ref 12–46)
MCH: 27.4 pg (ref 26.0–34.0)
MCHC: 31.8 g/dL (ref 30.0–36.0)
MCV: 86.2 fL (ref 78.0–100.0)
MONOS PCT: 7 % (ref 3–12)
Monocytes Absolute: 0.7 10*3/uL (ref 0.1–1.0)
NEUTROS ABS: 7.3 10*3/uL (ref 1.7–7.7)
Neutrophils Relative %: 71 % (ref 43–77)
Platelets: 255 10*3/uL (ref 150–400)
RBC: 4.92 MIL/uL (ref 3.87–5.11)
RDW: 16.4 % — ABNORMAL HIGH (ref 11.5–15.5)
WBC: 10.3 10*3/uL (ref 4.0–10.5)

## 2014-10-06 LAB — PREGNANCY, URINE: Preg Test, Ur: NEGATIVE

## 2014-10-06 MED ORDER — HYDROCODONE-ACETAMINOPHEN 5-325 MG PO TABS
1.0000 | ORAL_TABLET | Freq: Once | ORAL | Status: AC
  Administered 2014-10-06: 1 via ORAL
  Filled 2014-10-06: qty 1

## 2014-10-06 NOTE — ED Notes (Signed)
MD at bedside. 

## 2014-10-06 NOTE — ED Notes (Signed)
Pt placed on high fall risk protocol. Pt instructed not to get up w/o medical staff at bedside

## 2014-10-06 NOTE — Discharge Instructions (Signed)
Fall Prevention and Home Safety °Falls cause injuries and can affect all age groups. It is possible to prevent falls.  °HOW TO PREVENT FALLS °· Wear shoes with rubber soles that do not have an opening for your toes. °· Keep the inside and outside of your house well lit. °· Use night lights throughout your home. °· Remove clutter from floors. °· Clean up floor spills. °· Remove throw rugs or fasten them to the floor with carpet tape. °· Do not place electrical cords across pathways. °· Put grab bars by your tub, shower, and toilet. Do not use towel bars as grab bars. °· Put handrails on both sides of the stairway. Fix loose handrails. °· Do not climb on stools or stepladders, if possible. °· Do not wax your floors. °· Repair uneven or unsafe sidewalks, walkways, or stairs. °· Keep items you use a lot within reach. °· Be aware of pets. °· Keep emergency numbers next to the telephone. °· Put smoke detectors in your home and near bedrooms. °Ask your doctor what other things you can do to prevent falls. °Document Released: 01/04/2009 Document Revised: 09/09/2011 Document Reviewed: 06/10/2011 °Exit[employer]® Patient Information ©2015 [employer]. This information is not intended to replace advice given to you by your health care provider. Make sure you discuss any questions you have with your health care provider. ° °

## 2014-10-06 NOTE — ED Notes (Signed)
Pt placed on cont cardiac / POX monitoring.

## 2014-10-06 NOTE — ED Notes (Addendum)
C/o blacking out yesterday onto cement porch. Her daughter found her and states she was not out long.  Pt has hx of seizure. Abrasion to left elbow and and c/o left ankle pain. C/o lower left flank pain. Also has hematoma to left side of head.

## 2014-10-06 NOTE — ED Notes (Signed)
Presents today, states "I passed out yesterday on my front porch" having left ankle pain with swelling. States hit left side of head. Denies any dizziness, chest pain

## 2014-10-06 NOTE — ED Notes (Signed)
I removed i.v. Per nurse Sam, completed air cast splint to left ankle.

## 2014-10-06 NOTE — ED Provider Notes (Signed)
CSN: [id number]     Arrival date & time 10/06/14  1633 History   First MD Initiated Contact with Patient 10/06/14 1719     No chief complaint on file.    (Consider location/radiation/quality/duration/timing/severity/associated sxs/prior Treatment) Patient is a 54 y.o. female presenting with fall and syncope.  Fall Associated symptoms include headaches (left sided). Pertinent negatives include no chest pain, no abdominal pain and no shortness of breath.  Loss of Consciousness Episode history:  Single Most recent episode:  Yesterday Progression:  Unchanged Chronicity:  New Context comment:  Talking with the neighbor Witnessed: yes   Associated symptoms: headaches (left sided) and recent fall   Associated symptoms: no chest pain, no confusion, no diaphoresis, no difficulty breathing, no dizziness, no fever, no focal weakness, no nausea, no rectal bleeding, no seizures (hx of seizures, since off of psych med no seizures. No sign of seizure like activity yesterday), no shortness of breath, no vomiting and no weakness  Recent surgery: bladder surg 6mos.   Associated symptoms comment:  Tingling bilateral finger tips for a while seen pccp  Risk factors: no coronary artery disease   Risk factors comment:  Smoke   Past Medical History  Diagnosis Date  . Asthma   . COPD (chronic obstructive pulmonary disease)   . Hypertension   . Hyperlipidemia   . Seizures    Past Surgical History  Procedure Laterality Date  . Bladder surgery     No family history on file. History  Substance Use Topics  . Smoking status: Current Every Day Smoker -- 1.00 packs/day    Types: Cigarettes  . Smokeless tobacco: Not on file  . Alcohol Use: No   OB History    No data available     Review of Systems  Constitutional: Negative for fever and diaphoresis.  HENT: Negative for sore throat.   Eyes: Negative for visual disturbance.  Respiratory: Negative for cough and shortness of breath.    Cardiovascular: Positive for syncope. Negative for chest pain.  Gastrointestinal: Negative for nausea, vomiting and abdominal pain.  Genitourinary: Negative for difficulty urinating.  Musculoskeletal: Positive for back pain (mid back pain) and arthralgias (left ankle, left shoulder). Negative for neck pain.  Skin: Negative for rash.  Neurological: Positive for headaches (left sided). Negative for dizziness, focal weakness, seizures (hx of seizures, since off of psych med no seizures. No sign of seizure like activity yesterday), syncope and weakness.  Psychiatric/Behavioral: Negative for confusion.      Allergies  Review of patient's allergies indicates no known allergies.  Home Medications   Prior to Admission medications   Medication Sig Start Date End Date Taking? Authorizing Provider  ALPRAZolam Prudy Feeler) 1 MG tablet Take 1 mg by mouth at bedtime as needed for anxiety.   Yes Historical Provider, MD  cloNIDine (CATAPRES) 0.1 MG tablet Take 0.1 mg by mouth 2 (two) times daily.   Yes Historical Provider, MD  ipratropium-albuterol (DUONEB) 0.5-2.5 (3) MG/3ML SOLN Take 3 mLs by nebulization 4 (four) times daily as needed (wheezing).   Yes Historical Provider, MD   BP 152/114 mmHg  Pulse 75  Temp(Src) 98.8 F (37.1 C) (Oral)  Resp 18  Ht 5\' 4"  (1.626 m)  Wt 170 lb (77.111 kg)  BMI 29.17 kg/m2  SpO2 95% Physical Exam  Constitutional: She is oriented to person, place, and time. She appears well-developed and well-nourished. No distress.  HENT:  Head: Normocephalic.  Mouth/Throat: No oropharyngeal exudate.  Left parietal scalp hematoma   Eyes: Conjunctivae  and EOM are normal. Pupils are equal, round, and reactive to light.  Neck: Normal range of motion.  Cardiovascular: Normal rate, regular rhythm, normal heart sounds and intact distal pulses.  Exam reveals no gallop and no friction rub.   No murmur heard. Pulmonary/Chest: Effort normal and breath sounds normal. No respiratory  distress. She has no wheezes. She has no rales.  Abdominal: Soft. She exhibits no distension. There is no tenderness. There is no guarding.  Musculoskeletal: She exhibits no edema.       Left shoulder: She exhibits tenderness.       Left elbow: Lacerations: small abrasion/contusion. Tenderness found.       Left wrist: She exhibits no tenderness and no bony tenderness.       Left ankle: She exhibits swelling. She exhibits normal range of motion, no ecchymosis, no laceration and normal pulse. Tenderness. Lateral malleolus and AITFL tenderness found. No medial malleolus, no posterior TFL, no head of 5th metatarsal and no proximal fibula tenderness found.       Cervical back: She exhibits tenderness (left side). She exhibits no bony tenderness.       Thoracic back: She exhibits tenderness and bony tenderness.       Lumbar back: She exhibits tenderness and bony tenderness.  Neurological: She is alert and oriented to person, place, and time. She has normal strength. No cranial nerve deficit or sensory deficit. Coordination normal. GCS eye subscore is 4. GCS verbal subscore is 5. GCS motor subscore is 6.  Skin: Skin is warm and dry. No rash noted. She is not diaphoretic. No erythema.  Nursing note and vitals reviewed.   ED Course  Procedures (including critical care time) Labs Review Labs Reviewed  CBC WITH DIFFERENTIAL/PLATELET - Abnormal; Notable for the following:    RDW 16.4 (*)    All other components within normal limits  BASIC METABOLIC PANEL - Abnormal; Notable for the following:    Glucose, Bld 119 (*)    All other components within normal limits  PREGNANCY, URINE    Imaging Review Dg Thoracic Spine 2 View  10/06/2014   CLINICAL DATA:  Recent syncopal event with fall and upper chest pain, initial encounter  EXAM: THORACIC SPINE - 2-3 VIEWS  COMPARISON:  None.  FINDINGS: No acute fracture or dislocation is noted. No gross soft tissue abnormality is noted the surrounding ribcage is  within normal limits. No gross soft tissue abnormality is seen.  IMPRESSION: No acute abnormality noted.   Electronically Signed   By: Alcide CleverMark  Lukens M.D.   On: 10/06/2014 19:14   Dg Lumbar Spine 2-3 Views  10/06/2014   CLINICAL DATA:  54 year old female with back pain.  EXAM: LUMBAR SPINE - 2-3 VIEW  COMPARISON:  Radiograph dated 12/08/2013  FINDINGS: There is mild degenerative changes of the spine. There is mild compression of the superior endplate of the L2 vertebra, similar prior study. No definite acute fracture for subluxation identified.  Atherosclerotic calcification of the aorta. Multiple small high density structures noted in the right upper quadrant may represent gallstones or related to cholecystectomy surgical clips. Clinical correlation is recommended. Ultrasound may provide better evaluation of the gallbladder if clinically indicated. Tubal ligation clips noted within the pelvis.  IMPRESSION: No acute fracture or dislocation.   Electronically Signed   By: Elgie CollardArash  Radparvar M.D.   On: 10/06/2014 19:25   Dg Elbow 2 Views Left  10/06/2014   CLINICAL DATA:  54 year old female with trauma to the left elbow  EXAM:  LEFT ELBOW - 2 VIEW  COMPARISON:  None.  FINDINGS: There is no evidence of fracture, dislocation, or joint effusion. There is no evidence of arthropathy or other focal bone abnormality. Soft tissues are unremarkable.  IMPRESSION: Negative.   Electronically Signed   By: Elgie Collard M.D.   On: 10/06/2014 19:22   Dg Ankle Complete Left  10/06/2014   CLINICAL DATA:  Blacked out yesterday. Abrasion of the left elbow and foot/ ankle. Pain.  EXAM: LEFT ANKLE COMPLETE - 3+ VIEW  COMPARISON:  Left foot same day  FINDINGS: There is no evidence of fracture, dislocation, or joint effusion. There is no evidence of arthropathy or other focal bone abnormality. Soft tissues are unremarkable.  IMPRESSION: Negative.   Electronically Signed   By: Norva Pavlov M.D.   On: 10/06/2014 18:53   Ct Head Wo  Contrast  10/06/2014   CLINICAL DATA:  Recent syncopal episode with left-sided headaches  EXAM: CT HEAD WITHOUT CONTRAST  TECHNIQUE: Contiguous axial images were obtained from the base of the skull through the vertex without intravenous contrast.  COMPARISON:  01/04/2014  FINDINGS: The bony calvarium is intact. A small hematoma is noted in the left parietal scalp. No findings to suggest acute hemorrhage, acute infarction or space-occupying mass lesion are noted.  IMPRESSION: Small scalp hematoma in the left parietal region. No acute abnormality is noted.   Electronically Signed   By: Alcide Clever M.D.   On: 10/06/2014 19:25   Dg Shoulder Left  10/06/2014   CLINICAL DATA:  Recent syncopal event with left shoulder pain, initial encounter  EXAM: LEFT SHOULDER - 2+ VIEW  COMPARISON:  None.  FINDINGS: Mild degenerative changes of the acromioclavicular joint are seen. No fracture or dislocation is noted. The underlying bony thorax is within normal limits.  IMPRESSION: No acute abnormality noted.   Electronically Signed   By: Alcide Clever M.D.   On: 10/06/2014 19:15   Dg Foot Complete Left  10/06/2014   CLINICAL DATA:  Left foot and ankle pain.  Fall.  EXAM: LEFT FOOT - COMPLETE 3+ VIEW  COMPARISON:  None  FINDINGS: There is no evidence of fracture or dislocation. There is no evidence of arthropathy or other focal bone abnormality. Soft tissues are unremarkable.  IMPRESSION: Negative.   Electronically Signed   By: Signa Kell M.D.   On: 10/06/2014 19:23     EKG Interpretation   Date/Time:  Friday October 06 2014 17:22:47 EDT Ventricular Rate:  81 PR Interval:  136 QRS Duration: 82 QT Interval:  370 QTC Calculation: 429 R Axis:   54 Text Interpretation:  Normal sinus rhythm Normal ECG Confirmed by  [facility] MD, Kaide Gage ([id number]) on 10/06/2014 11:24:24 PM      MDM   Final diagnoses:  Fall  Syncope, unspecified syncope type  Left ankle sprain, initial encounter   54yo female with history of COPD,  htn, hlpd, seizures (which stopped after discontinuing psych medication) presents with concern of episode of syncope yesterday with headache, back pain, left shoulder pain, left ankle pain.   EKG evaluated by me and shows sinus rhythm with no sign of prolonged QTc, no brugada, no sign of HOCM, no ST abnormalities. No sign of dysrhythmia while on cardiac monitor.  No history of seizure like activity, no postictal state.  No CP to suggest PE/ACS.  No sign of anemia/electrolyte abnormality.  Recommend close outpatient follow up regarding syncope.  Pt with several areas of pain after fall.  Patient without any  midline tenderness, no neurologic deficits, no distracting injuries, no intoxication and have low suspicion for cervical spine injury by Nexus criteria.  Patient most likely with cervical muscle strain.  Head CT radiology imaging and radiology report evaluated by me and shows no acute intracranial abnormality.  XR evaluated by me and radiology showing no sign of acute fracture of thoracic, lumbar spine, no ankle fx, elbow or shoulder fx.  Pt with likely muscular pain in upper extremity and back, and given swelling of left ankle and ATFL likely ankle sprain. Provided air cast.  Recommended tylenol/ibuprofen, elevation, ice/heat. Patient discharged in stable condition with understanding of reasons to return.             Alvira Monday, MD 10/07/14 1432

## 2017-04-05 ENCOUNTER — Emergency Department (HOSPITAL_BASED_OUTPATIENT_CLINIC_OR_DEPARTMENT_OTHER)
Admission: EM | Admit: 2017-04-05 | Discharge: 2017-04-05 | Disposition: A | Discharge status: Home / Self Care | Payer: Medicaid Other | Attending: Emergency Medicine | Admitting: Emergency Medicine

## 2017-04-05 ENCOUNTER — Encounter (HOSPITAL_BASED_OUTPATIENT_CLINIC_OR_DEPARTMENT_OTHER): Payer: Self-pay | Admitting: Emergency Medicine

## 2017-04-05 ENCOUNTER — Emergency Department (HOSPITAL_BASED_OUTPATIENT_CLINIC_OR_DEPARTMENT_OTHER): Payer: Medicaid Other

## 2017-04-05 ENCOUNTER — Other Ambulatory Visit: Payer: Self-pay

## 2017-04-05 DIAGNOSIS — Z8673 Personal history of transient ischemic attack (TIA), and cerebral infarction without residual deficits: Secondary | ICD-10-CM | POA: Insufficient documentation

## 2017-04-05 DIAGNOSIS — R531 Weakness: Secondary | ICD-10-CM | POA: Insufficient documentation

## 2017-04-05 DIAGNOSIS — J45909 Unspecified asthma, uncomplicated: Secondary | ICD-10-CM | POA: Diagnosis not present

## 2017-04-05 DIAGNOSIS — I10 Essential (primary) hypertension: Secondary | ICD-10-CM | POA: Insufficient documentation

## 2017-04-05 DIAGNOSIS — H538 Other visual disturbances: Secondary | ICD-10-CM | POA: Diagnosis not present

## 2017-04-05 DIAGNOSIS — Z79899 Other long term (current) drug therapy: Secondary | ICD-10-CM | POA: Diagnosis not present

## 2017-04-05 DIAGNOSIS — G43109 Migraine with aura, not intractable, without status migrainosus: Secondary | ICD-10-CM

## 2017-04-05 DIAGNOSIS — R51 Headache: Secondary | ICD-10-CM | POA: Diagnosis present

## 2017-04-05 DIAGNOSIS — J449 Chronic obstructive pulmonary disease, unspecified: Secondary | ICD-10-CM | POA: Insufficient documentation

## 2017-04-05 DIAGNOSIS — F17210 Nicotine dependence, cigarettes, uncomplicated: Secondary | ICD-10-CM | POA: Insufficient documentation

## 2017-04-05 HISTORY — DX: Cerebral infarction, unspecified: I63.9

## 2017-04-05 HISTORY — DX: Migraine, unspecified, not intractable, without status migrainosus: G43.909

## 2017-04-05 LAB — COMPREHENSIVE METABOLIC PANEL
ALT: 22 U/L (ref 14–54)
AST: 16 U/L (ref 15–41)
Albumin: 3.8 g/dL (ref 3.5–5.0)
Alkaline Phosphatase: 118 U/L (ref 38–126)
Anion gap: 10 (ref 5–15)
BILIRUBIN TOTAL: 0.3 mg/dL (ref 0.3–1.2)
BUN: 7 mg/dL (ref 6–20)
CHLORIDE: 103 mmol/L (ref 101–111)
CO2: 27 mmol/L (ref 22–32)
CREATININE: 0.77 mg/dL (ref 0.44–1.00)
Calcium: 9.1 mg/dL (ref 8.9–10.3)
Glucose, Bld: 78 mg/dL (ref 65–99)
Potassium: 3.9 mmol/L (ref 3.5–5.1)
Sodium: 140 mmol/L (ref 135–145)
Total Protein: 7.5 g/dL (ref 6.5–8.1)

## 2017-04-05 LAB — DIFFERENTIAL
BASOS ABS: 0 10*3/uL (ref 0.0–0.1)
Basophils Relative: 1 %
Eosinophils Absolute: 0.2 10*3/uL (ref 0.0–0.7)
Eosinophils Relative: 2 %
LYMPHS ABS: 1.5 10*3/uL (ref 0.7–4.0)
LYMPHS PCT: 19 %
MONO ABS: 0.5 10*3/uL (ref 0.1–1.0)
MONOS PCT: 6 %
NEUTROS ABS: 5.9 10*3/uL (ref 1.7–7.7)
Neutrophils Relative %: 72 %

## 2017-04-05 LAB — CBC
HEMATOCRIT: 44.1 % (ref 36.0–46.0)
HEMOGLOBIN: 14.7 g/dL (ref 12.0–15.0)
MCH: 29.8 pg (ref 26.0–34.0)
MCHC: 33.3 g/dL (ref 30.0–36.0)
MCV: 89.3 fL (ref 78.0–100.0)
Platelets: 262 10*3/uL (ref 150–400)
RBC: 4.94 MIL/uL (ref 3.87–5.11)
RDW: 13.8 % (ref 11.5–15.5)
WBC: 8.2 10*3/uL (ref 4.0–10.5)

## 2017-04-05 LAB — APTT: APTT: 31 s (ref 24–36)

## 2017-04-05 LAB — PROTIME-INR
INR: 0.82
Prothrombin Time: 11.2 seconds — ABNORMAL LOW (ref 11.4–15.2)

## 2017-04-05 LAB — ETHANOL

## 2017-04-05 LAB — TROPONIN I

## 2017-04-05 MED ORDER — PROMETHAZINE HCL 25 MG/ML IJ SOLN
25.0000 mg | Freq: Once | INTRAMUSCULAR | Status: AC
  Administered 2017-04-05: 25 mg via INTRAVENOUS
  Filled 2017-04-05: qty 1

## 2017-04-05 MED ORDER — PROMETHAZINE HCL 12.5 MG PO TABS: 12.5000 mg | ORAL_TABLET | Freq: Four times a day (QID) | ORAL | 0 refills | Status: AC | PRN

## 2017-04-05 NOTE — ED Provider Notes (Signed)
[facility] EMERGENCY DEPARTMENT Provider Note   CSN: [id number] Arrival date & time: 04/05/17  1019     History   Chief Complaint Chief Complaint  Patient presents with  . Headache    HPI Lindsay Adams is a 57 y.o. 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  Patient states that she woke around 7 AM and had acute onset of headache with associated worsening weakness on the left side.  She also has some subjective blurry vision as well.  She claims that she had the worst headache of her life as well.  Of note, patient has similar symptoms before and was in [facility] about 10 days ago for left-sided weakness as well as strokelike symptoms.  She saw neurology at that time and had an MRI brain that did not show any acute infarct.  Over the last year or so she had multiple CTs and MRI of her brain.   The history is provided by the patient.    Past Medical History:  Diagnosis Date  . Asthma   . COPD (chronic obstructive pulmonary disease) (HCC)   . Hyperlipidemia   . Hypertension   . Migraine headache   . Seizures (HCC)   . Stroke [facility])     There are no active problems to display for this patient.   Past Surgical History:  Procedure Laterality Date  . BLADDER SURGERY      OB History    No data available       Home Medications    Prior to Admission medications   Medication Sig Start Date End Date Taking? Authorizing Provider  ALPRAZolam Prudy Feeler) 1 MG tablet Take 1 mg by mouth at bedtime as needed for anxiety.    [provider]  cloNIDine (CATAPRES) 0.1 MG tablet Take 0.1 mg by mouth 2 (two) times daily.    [provider]  ipratropium-albuterol (DUONEB) 0.5-2.5 (3) MG/3ML SOLN Take 3 mLs by nebulization 4 (four) times daily as needed (wheezing).    [provider]    Family History History reviewed. No  pertinent family history.  Social History Social History   Tobacco Use  . Smoking status: Current Every Day Smoker    Packs/day: 1.00    Types: Cigarettes  . Smokeless tobacco: Never Used  Substance Use Topics  . Alcohol use: No  . Drug use: Not on file     Allergies   Patient has no known allergies.   Review of Systems Review of Systems  Neurological: Positive for weakness and headaches.  All other systems reviewed and are negative.    Physical Exam Updated Vital Signs BP 113/86   Pulse 66   Temp 98.6 F (37 C)   Resp 20   Ht 5\' 4"  (1.626 m)   Wt 66.7 kg (147 lb)   SpO2 94%   BMI 25.23 kg/m   Physical Exam  Constitutional: She is oriented to person, place, and time.  Anxious, slightly uncomfortable   HENT:  Head: Normocephalic.  Mouth/Throat: Oropharynx is clear and moist.  Eyes: EOM are normal. Pupils are equal, round, and reactive to light.  Neck: Normal range of motion.  Cardiovascular: Normal rate.  Pulmonary/Chest: Effort normal.  Abdominal: Soft.  Musculoskeletal: Normal range of motion.  Neurological: She is alert and oriented to person, place, and time.  Strength 4/5 L arm and leg. No obvious slurred speech, ? R facial droop.  Skin: Skin is warm.  Psychiatric: She has a normal mood and affect.  Nursing note and vitals reviewed.    ED Treatments / Results  Labs (all labs ordered are listed, but only abnormal results are displayed) Labs Reviewed  CBC  DIFFERENTIAL  ETHANOL  PROTIME-INR  APTT  COMPREHENSIVE METABOLIC PANEL  TROPONIN I  RAPID URINE DRUG SCREEN, HOSP PERFORMED  URINALYSIS, ROUTINE W REFLEX MICROSCOPIC    EKG  EKG Interpretation None       Radiology Ct Head Wo Contrast  Result Date: 04/05/2017 CLINICAL DATA:  Severe headache starting this morning. Transient vision loss. EXAM: CT HEAD WITHOUT CONTRAST TECHNIQUE: Contiguous axial images were obtained from the base of the skull through the vertex without  intravenous contrast. COMPARISON:  Brain MR dated 03/26/2017 and head CT dated 11/09/2016. FINDINGS: Brain: Diffusely enlarged ventricles and subarachnoid spaces. Patchy white matter low density in both cerebral hemispheres. No intracranial hemorrhage, mass lesion or CT evidence of acute infarction. Vascular: No hyperdense vessel or unexpected calcification. Skull: Normal. Negative for fracture or focal lesion. Sinuses/Orbits: Unremarkable. Other: None. IMPRESSION: 1. No acute abnormality. 2. Minimal diffuse cerebral and cerebellar atrophy. 3. Minimal chronic small vessel white matter ischemic changes in both cerebral hemispheres. Electronically Signed   By: Beckie SaltsSteven  Reid M.D.   On: 04/05/2017 11:16    Procedures Procedures (including critical care time)  Medications Ordered in ED Medications  promethazine (PHENERGAN) injection 25 mg (25 mg Intravenous Given 04/05/17 1112)     Initial Impression / Assessment and Plan / ED Course  I have reviewed the triage vital signs and the nursing notes.  Pertinent labs & imaging results that were available during my care of the patient were reviewed by me and considered in my medical decision making (see chart for details).    Lindsay Adams is a 57 y.o. female here with headache, L arm and leg weakness. L arm and leg weakness is chronic. She claims to have worse headache of her life but she has complained of headache before. She has no known cerebral aneurysms and is within 6 hr window so if CT head negative, will not need LP. Will get stroke labs, swallow eval, CT head. Will give migraine cocktail   11:37 AM Labs at baseline. CT head showed no bleed. Has previous R facial droop and L arm and leg weakness documented by neurologist [facility]. Has neuro follow up already. I think likely her complex migraine. Her headache improved with IV phenergan. Stable for discharge   Final Clinical Impressions(s) / ED Diagnoses   Final diagnoses:  None      ED Discharge Orders    None       Charlynne PanderYao, Selita Staiger Hsienta, MD 04/05/17 [id number]

## 2017-04-05 NOTE — ED Notes (Signed)
Patient transported to CT 

## 2017-04-05 NOTE — Discharge Instructions (Signed)
Take tylenol, motrin for headaches.   Take phenergan for severe headaches.   You need to see your neurologist at [place] [facility]   Return to ER if you have worse headaches, vomiting, worse weakness, numbness, trouble speaking

## 2017-04-05 NOTE — ED Triage Notes (Signed)
Patient states that she woke up this am at 7 am - the patient reports that she started to have an acute Headache - worse than her migraines " worst headache"  - the patient reports that she heard a pop to the right side of her head. The patient states that she has acute loss of vision and now her vision is blurred

## 2018-03-06 IMAGING — CT CT HEAD W/O CM
3 series · 16 of 47 positions shown, 19 images · non-contrast
Comparison: Brain MR dated 03/26/2017 and head CT dated 11/09/2016.

CLINICAL DATA: Severe headache starting this morning. Transient
vision loss.

EXAM:
CT HEAD WITHOUT CONTRAST
TECHNIQUE: Contiguous axial images were obtained from the base of the skull
through the vertex without intravenous contrast.

[Series 2: head wo · axial · 0.39mm/px · z∈[-150,-25]mm · 10 of 31 slices shown, 13 images]
[im 3/31  brain]
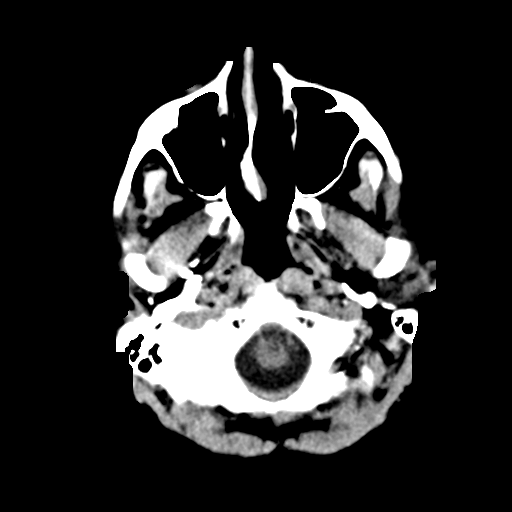
[im 3/31  bone]
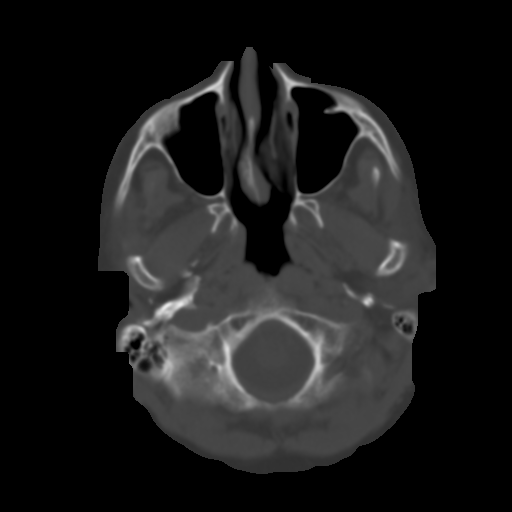
[im 6/31  brain]
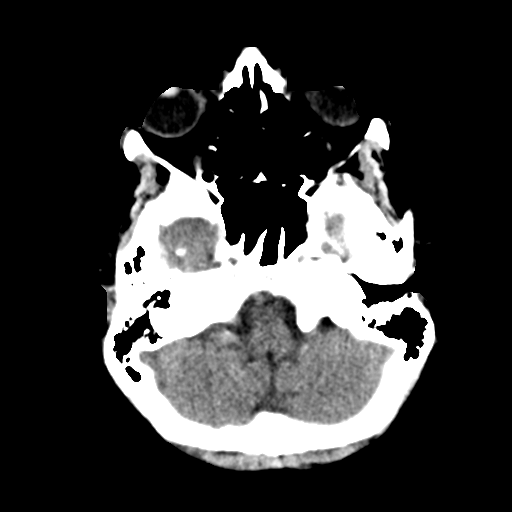
[im 9/31  brain]
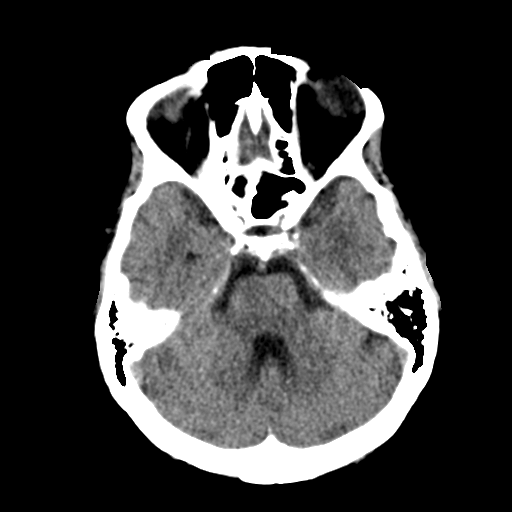
[im 11/31  brain]
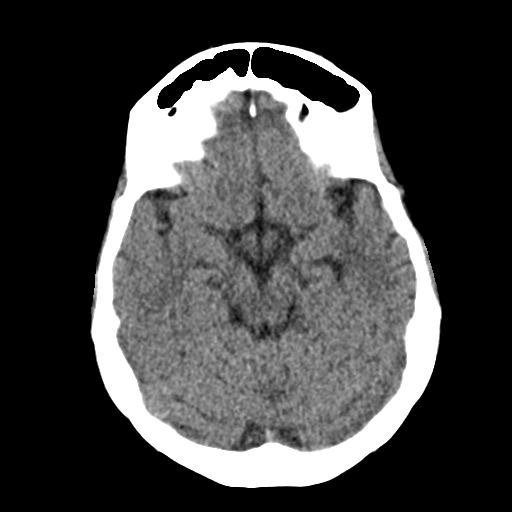
[im 14/31  brain]
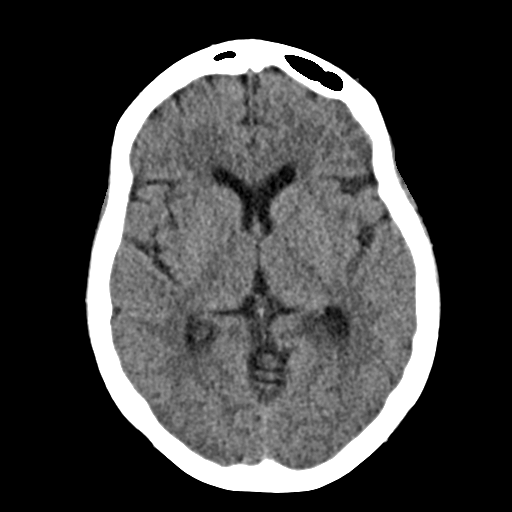
[im 14/31  bone]
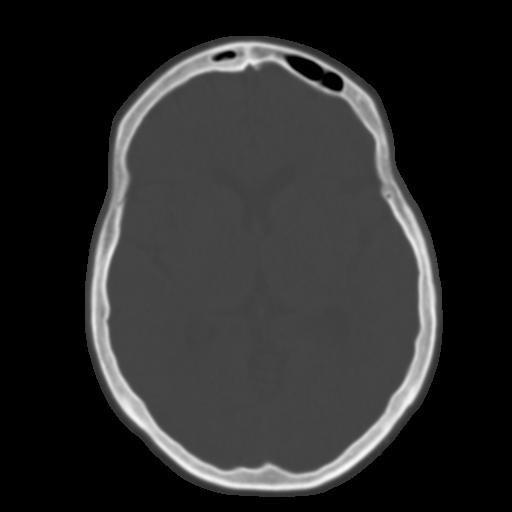
[im 17/31  brain]
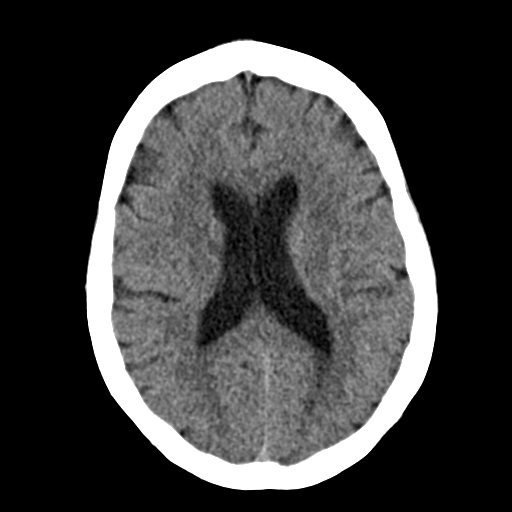
[im 20/31  brain]
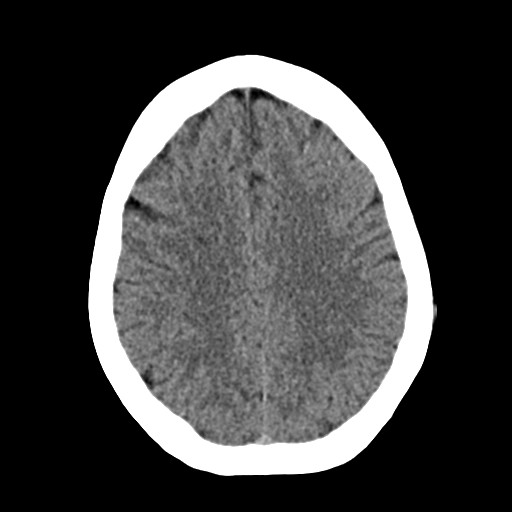
[im 23/31  brain]
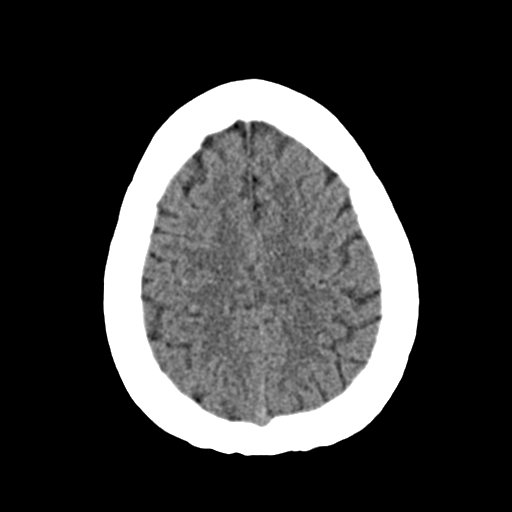
[im 25/31  brain]
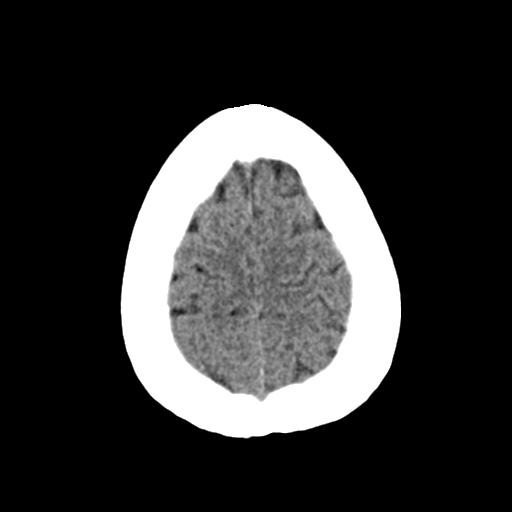
[im 25/31  bone]
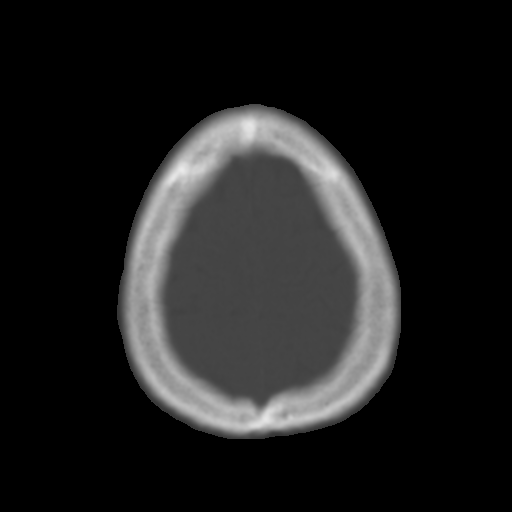
[im 28/31  brain]
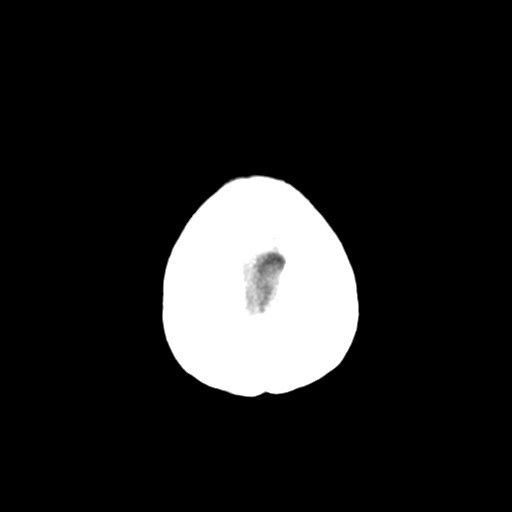

[Series 4: coronal soft · coronal · 0.36mm/px · 3 of 67 slices shown]
[im 23/67  brain]
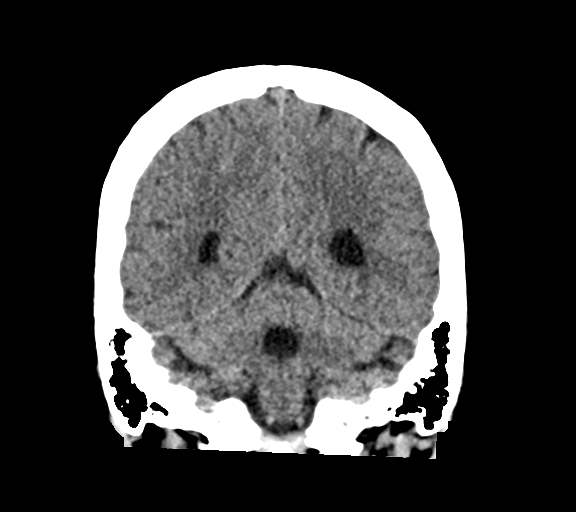
[im 30/67  brain]
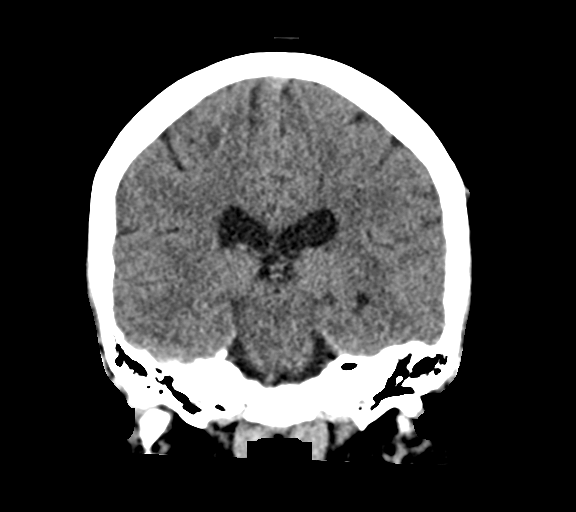
[im 37/67  brain]
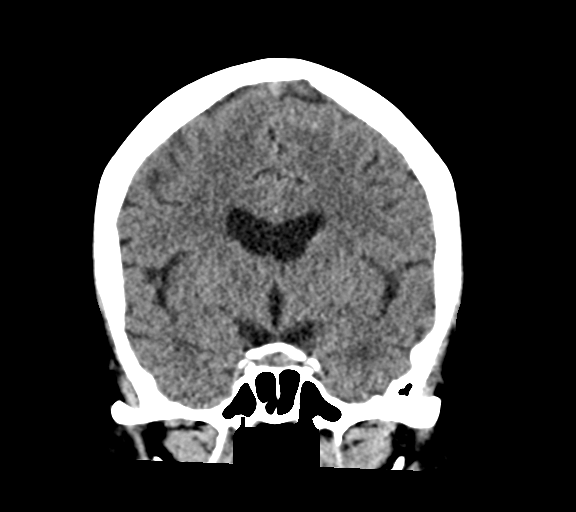

[Series 5: sag soft · sagittal · 0.36mm/px · 3 of 55 slices shown]
[im 19/55  brain]
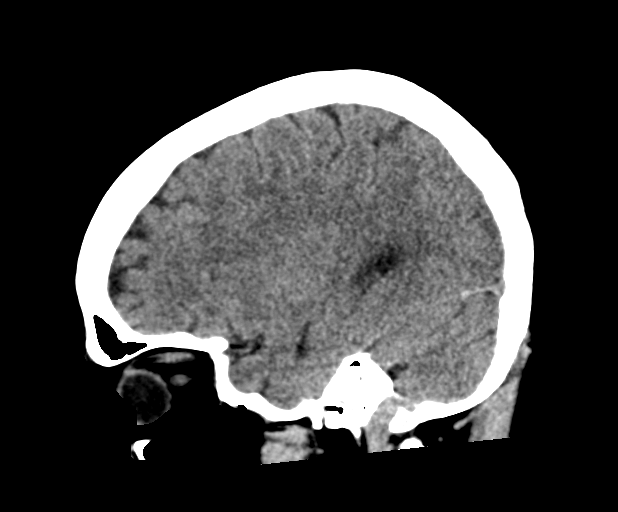
[im 28/55  brain]
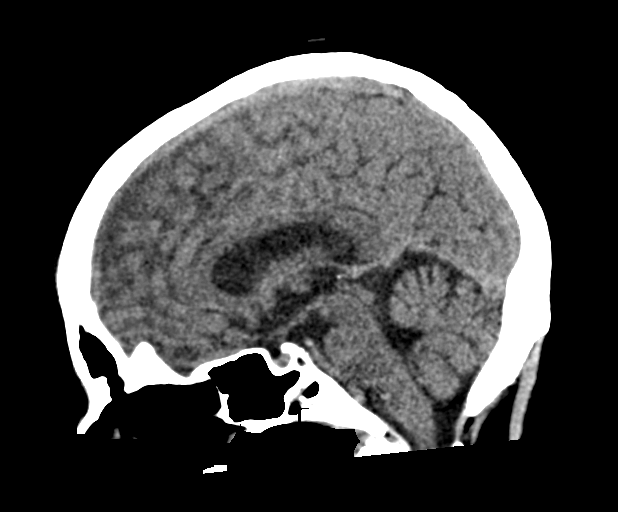
[im 37/55  brain]
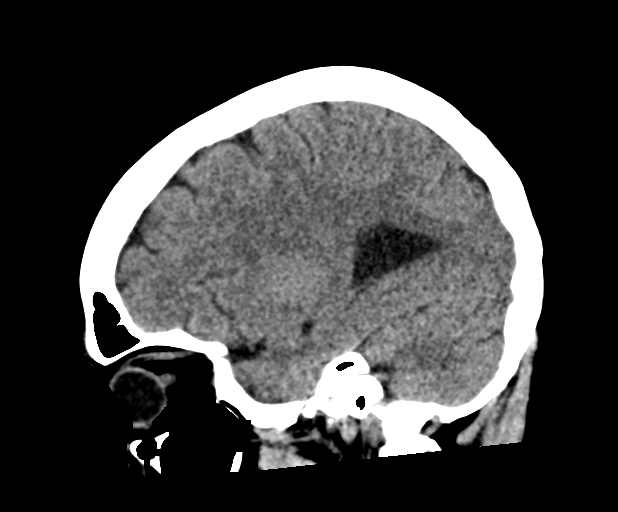

[16 of 47 positions shown; findings below may reference images not displayed]

FINDINGS: Brain: Diffusely enlarged ventricles and subarachnoid spaces. Patchy
white matter low density in both cerebral hemispheres. No
intracranial hemorrhage, mass lesion or CT evidence of acute
infarction.

Vascular: No hyperdense vessel or unexpected calcification.

Skull: Normal. Negative for fracture or focal lesion.

Sinuses/Orbits: Unremarkable.

Other: None.
IMPRESSION: 1. No acute abnormality.
2. Minimal diffuse cerebral and cerebellar atrophy.
3. Minimal chronic small vessel white matter ischemic changes in
both cerebral hemispheres.

## 2018-06-23 DEATH — deceased
